# Patient Record
Sex: Male | Born: 1995 | Race: White | Hispanic: No | Marital: Single
Health system: Southern US, Community
[De-identification: ages and names within clinical notes are randomized; demographics above are authoritative.]

---
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
## 2023-01-25 ENCOUNTER — Emergency Department
Admission: EM | Admit: 2023-01-25 | Discharge: 2023-01-26 | Disposition: A | Discharge status: Home / Self Care | Payer: Worker's Compensation | Attending: Emergency Medicine | Admitting: Emergency Medicine

## 2023-01-25 DIAGNOSIS — W311XXA Contact with metalworking machines, initial encounter: Secondary | ICD-10-CM | POA: Insufficient documentation

## 2023-01-25 DIAGNOSIS — T1501XA Foreign body in cornea, right eye, initial encounter: Secondary | ICD-10-CM | POA: Insufficient documentation

## 2023-01-25 DIAGNOSIS — H5711 Ocular pain, right eye: Secondary | ICD-10-CM | POA: Diagnosis present

## 2023-01-25 MED ORDER — FLUORESCEIN SODIUM 1 MG OP STRP
1.0000 | ORAL_STRIP | Freq: Once | OPHTHALMIC | Status: AC
  Administered 2023-01-26: 1 via OPHTHALMIC
  Filled 2023-01-25: qty 1

## 2023-01-25 MED ORDER — TETRACAINE HCL 0.5 % OP SOLN
2.0000 [drp] | Freq: Once | OPHTHALMIC | Status: AC
  Administered 2023-01-26: 2 [drp] via OPHTHALMIC
  Filled 2023-01-25: qty 4
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
## 2023-01-26 MED ORDER — ERYTHROMYCIN 5 MG/GM OP OINT
TOPICAL_OINTMENT | Freq: Once | OPHTHALMIC | Status: AC
  Administered 2023-01-26: 1 via OPHTHALMIC
  Filled 2023-01-26: qty 1

## 2023-01-26 MED ORDER — ERYTHROMYCIN 5 MG/GM OP OINT: 1.0000 | TOPICAL_OINTMENT | Freq: Four times a day (QID) | OPHTHALMIC | 1 refills | Status: AC

## 2023-01-26 NOTE — Discharge Instructions (Addendum)
Please go to the ophthalmology office at the address listed at 8 AM in the morning 01/26/2023 for evaluation by ophthalmology.  Dr. Inez Pilgrim is aware.  Please inform them that you were seen in the ER and told to follow-up in the morning.  Please use your erythromycin ointment as prescribed, Tylenol/ibuprofen as needed for discomfort.  Do not wear contact lenses x 10 days.

## 2023-01-26 NOTE — ED Provider Notes (Signed)
[facility] Provider Note    Event Date/Time   First MD Initiated Contact with Patient 01/25/23 2341     (approximate)  History   Chief Complaint: Foreign Body in Eye  HPI  RODERICK MCMANIGAL is a 27 y.o. male with no significant past medical history who presents to the emergency department for right eye pain and irritation.  According to the patient he was grinding metal earlier today and then later this evening felt discomfort in the right eye.  States he has been rubbing it and thinks there is something stuck in the eye.  Denies any other symptoms.  Physical Exam   Triage Vital Signs: ED Triage Vitals  Enc Vitals Group     BP 01/25/23 2249 132/88     Pulse Rate 01/25/23 2249 (!) 53     Resp 01/25/23 2249 17     Temp 01/25/23 2249 98.1 F (36.7 C)     Temp Source 01/25/23 2249 Oral     SpO2 01/25/23 2249 98 %     Weight 01/25/23 2248 200 lb (90.7 kg)     Height 01/25/23 2248 5\' 11"  (1.803 m)     Head Circumference --      Peak Flow --      Pain Score 01/25/23 2247 6     Pain Loc --      Pain Edu? --      Excl. in GC? --     Most recent vital signs: Vitals:   01/25/23 2249  BP: 132/88  Pulse: (!) 53  Resp: 17  Temp: 98.1 F (36.7 C)  SpO2: 98%    General: Awake, no distress.  CV:  Good peripheral perfusion.  Regular rate and rhythm  Resp:  Normal effort. Abd:  No distention.   Other:  Eye numbed with tetracaine.  Woods lamp examination shows a small scleral abrasion on the left side but also a very small embedded foreign body in the left part of the cornea.   ED Results / Procedures / Treatments   MEDICATIONS ORDERED IN ED: Medications  fluorescein ophthalmic strip 1 strip (has no administration in time range)  tetracaine (PONTOCAINE) 0.5 % ophthalmic solution 2 drop (has no administration in time range)     IMPRESSION / MDM / ASSESSMENT AND PLAN / ED COURSE  I reviewed the triage vital signs and the nursing notes.  Patient's  presentation is most consistent with acute illness / injury with system symptoms.  Patient presents emergency department for right eye pain.  On exam patient has a small embedded foreign body to the left side of the cornea.  Numbed the eye with tetracaine used fluorescein patient does have a very small scleral abrasion medial to the embedded foreign body.  I was able to use a Q-tip and a blunt needle.  I was able to remove a good portion of the foreign body but there is still a small speck remaining of the foreign body.  Given the remaining foreign body in the cornea we will have the patient follow-up with ophthalmology in the morning.  I sent a message to Dr. Inez Pilgrim who is in agreement states patient can come to the office first thing in the morning.  Will discharge with erythromycin ointment.  Patient agreeable to plan of care.  FINAL CLINICAL IMPRESSION(S) / ED DIAGNOSES   Embedded foreign body right cornea Scleral abrasion    Note:  This document was prepared using Dragon voice recognition software and  may include unintentional dictation errors.   Minna Antis, MD 01/26/23 0010
